# Patient Record
Sex: Female | Born: 1965 | Race: White | Hispanic: No | Marital: Married | Smoking: Never smoker
Health system: Southern US, Community
[De-identification: ages and names within clinical notes are randomized; demographics above are authoritative.]

## PROBLEM LIST (undated history)

## (undated) DIAGNOSIS — Z8619 Personal history of other infectious and parasitic diseases: Secondary | ICD-10-CM

## (undated) DIAGNOSIS — G43909 Migraine, unspecified, not intractable, without status migrainosus: Secondary | ICD-10-CM

## (undated) DIAGNOSIS — E063 Autoimmune thyroiditis: Secondary | ICD-10-CM

## (undated) HISTORY — DX: Migraine, unspecified, not intractable, without status migrainosus: G43.909

## (undated) HISTORY — DX: Autoimmune thyroiditis: E06.3

## (undated) HISTORY — DX: Personal history of other infectious and parasitic diseases: Z86.19

---

## 2011-03-09 LAB — HM MAMMOGRAPHY

## 2011-04-08 LAB — HM MAMMOGRAPHY

## 2012-07-14 ENCOUNTER — Ambulatory Visit: Payer: Self-pay | Admitting: Internal Medicine

## 2012-07-27 ENCOUNTER — Ambulatory Visit (INDEPENDENT_AMBULATORY_CARE_PROVIDER_SITE_OTHER): Payer: BC Managed Care – PPO | Admitting: Internal Medicine

## 2012-07-27 ENCOUNTER — Other Ambulatory Visit (HOSPITAL_COMMUNITY)
Admission: RE | Admit: 2012-07-27 | Discharge: 2012-07-27 | Disposition: A | Discharge status: Home / Self Care | Payer: BC Managed Care – PPO | Source: Ambulatory Visit | Attending: Internal Medicine | Admitting: Internal Medicine

## 2012-07-27 ENCOUNTER — Encounter: Payer: Self-pay | Admitting: Internal Medicine

## 2012-07-27 VITALS — BP 102/62 | HR 67 | Temp 98.0°F | Ht 65.0 in | Wt 131.0 lb

## 2012-07-27 DIAGNOSIS — Z130 Encounter for screening for diseases of the blood and blood-forming organs and certain disorders involving the immune mechanism: Secondary | ICD-10-CM

## 2012-07-27 DIAGNOSIS — E063 Autoimmune thyroiditis: Secondary | ICD-10-CM | POA: Insufficient documentation

## 2012-07-27 DIAGNOSIS — Z13220 Encounter for screening for lipoid disorders: Secondary | ICD-10-CM

## 2012-07-27 DIAGNOSIS — Z01419 Encounter for gynecological examination (general) (routine) without abnormal findings: Secondary | ICD-10-CM | POA: Insufficient documentation

## 2012-07-27 DIAGNOSIS — Z131 Encounter for screening for diabetes mellitus: Secondary | ICD-10-CM

## 2012-07-27 DIAGNOSIS — Z1329 Encounter for screening for other suspected endocrine disorder: Secondary | ICD-10-CM

## 2012-07-27 DIAGNOSIS — Z1151 Encounter for screening for human papillomavirus (HPV): Secondary | ICD-10-CM | POA: Insufficient documentation

## 2012-07-27 DIAGNOSIS — Z124 Encounter for screening for malignant neoplasm of cervix: Secondary | ICD-10-CM

## 2012-07-27 DIAGNOSIS — Z0000 Encounter for general adult medical examination without abnormal findings: Secondary | ICD-10-CM

## 2012-07-27 NOTE — Progress Notes (Signed)
HPI  Pt presents to the clinic today to establish care. She moved from [place] 7 months ago and has not established a PCP in the area. She has no concerns today:  Flu: 2013 Tetanus: less than 10 years ago Eye doctor: yearly Dentist: yearly Pap smear: 2012 Mammogram: 03/2011  Past Medical History  Diagnosis Date  . History of chickenpox   . Hashimoto's thyroiditis   . Migraine     Current Outpatient Prescriptions  Medication Sig Dispense Refill  . SYNTHROID 50 MCG tablet Take 50 mcg by mouth daily before breakfast.        No current facility-administered medications for this visit.    Allergies  Allergen Reactions  . Codeine Nausea Only    Family History  Problem Relation Age of Onset  . Thyroid cancer Other   . Heart disease Other     Parent    History   Social History  . Marital Status: Married    Spouse Name: N/A    Number of Children: N/A  . Years of Education: 16   Occupational History  . Business Owner    Social History Main Topics  . Smoking status: Never Smoker   . Smokeless tobacco: Never Used  . Alcohol Use: Yes  . Drug Use: No  . Sexually Active: Not on file   Other Topics Concern  . Not on file   Social History Narrative   Regular exercise-yes   Caffeine Use-yes    ROS:  Constitutional: Denies fever, malaise, fatigue, headache or abrupt weight changes.  HEENT: Denies eye pain, eye redness, ear pain, ringing in the ears, wax buildup, runny nose, nasal congestion, bloody nose, or sore throat. Respiratory: Denies difficulty breathing, shortness of breath, cough or sputum production.   Cardiovascular: Denies chest pain, chest tightness, palpitations or swelling in the hands or feet.  Gastrointestinal: Denies abdominal pain, bloating, constipation, diarrhea or blood in the stool.  GU: Denies frequency, urgency, pain with urination, blood in urine, odor or discharge. Musculoskeletal: Denies decrease in range of motion, difficulty with gait,  muscle pain or joint pain and swelling.  Skin: Denies redness, rashes, lesions or ulcercations.  Neurological: Denies dizziness, difficulty with memory, difficulty with speech or problems with balance and coordination.   No other specific complaints in a complete review of systems (except as listed in HPI above).  PE:  BP 102/62  Pulse 67  Temp(Src) 98 F (36.7 C) (Oral)  Ht 5\' 5"  (1.651 m)  Wt 131 lb (59.421 kg)  BMI 21.8 kg/m2  SpO2 96% Wt Readings from Last 3 Encounters:  07/27/12 131 lb (59.421 kg)    General: Appears her stated age, well developed, well nourished in NAD. HEENT: Head: normal shape and size; Eyes: sclera white, no icterus, conjunctiva pink, PERRLA and EOMs intact; Ears: Tm's gray and intact, normal light reflex; Nose: mucosa pink and moist, septum midline; Throat/Mouth: Teeth present, mucosa pink and moist, no lesions or ulcerations noted.  Neck: Normal range of motion. Neck supple, trachea midline. No massses, lumps or thyromegaly present.  Cardiovascular: Normal rate and rhythm. S1,S2 noted.  No murmur, rubs or gallops noted. No JVD or BLE edema. No carotid bruits noted. Pulmonary/Chest: Normal effort and positive vesicular breath sounds. No respiratory distress. No wheezes, rales or ronchi noted.  Abdomen: Soft and nontender. Normal bowel sounds, no bruits noted. No distention or masses noted. Liver, spleen and kidneys non palpable. Musculoskeletal: Normal range of motion. No signs of joint swelling. No difficulty with  gait.  Neurological: Alert and oriented. Cranial nerves II-XII intact. Coordination normal. +DTRs bilaterally. Psychiatric: Mood and affect normal. Behavior is normal. Judgment and thought content normal.     Assessment and Plan:  Preventative Health Maintenance:  Will set up mammogram Pap smear obtained today Continue your diet and exercise routine

## 2012-07-27 NOTE — Assessment & Plan Note (Signed)
Will order thyroid ultrasound Referral placed to Dr. Lafe Garin per pt request

## 2012-07-27 NOTE — Patient Instructions (Signed)

## 2012-08-03 ENCOUNTER — Encounter: Payer: Self-pay | Admitting: Internal Medicine

## 2012-08-03 ENCOUNTER — Ambulatory Visit: Payer: BC Managed Care – PPO | Admitting: Endocrinology

## 2012-08-08 ENCOUNTER — Other Ambulatory Visit: Payer: BC Managed Care – PPO

## 2012-08-10 ENCOUNTER — Ambulatory Visit (INDEPENDENT_AMBULATORY_CARE_PROVIDER_SITE_OTHER): Payer: BC Managed Care – PPO | Admitting: Internal Medicine

## 2012-08-10 ENCOUNTER — Encounter: Payer: Self-pay | Admitting: Internal Medicine

## 2012-08-10 VITALS — BP 110/68 | HR 63 | Temp 97.5°F | Ht 65.0 in | Wt 131.0 lb

## 2012-08-10 DIAGNOSIS — J069 Acute upper respiratory infection, unspecified: Secondary | ICD-10-CM

## 2012-08-10 NOTE — Patient Instructions (Signed)

## 2012-08-10 NOTE — Progress Notes (Signed)
Subjective:    Patient ID: Shelby Harvey, female    DOB: 1965/10/19, 47 y.o.   MRN: [id number]  HPI  Pt presents to the clinic today for hospital f/u. She was seen at [facility] ER on 08/04/2012 for abdominal pain. Xray showed mild constipation but no acute findings. She ended up being diagnosed with mountain sickness. Her abdominal pain has resolved. She does have a mild cough and does at time feel shortness of breath. She is not sure if she picked up something on her way back.. She denies fever, chills or body aches. She has not had sick contacts that she is aware of.   Review of Systems      Past Medical History  Diagnosis Date  . History of chickenpox   . Hashimoto's thyroiditis   . Migraine     Current Outpatient Prescriptions  Medication Sig Dispense Refill  . SYNTHROID 50 MCG tablet Take 50 mcg by mouth daily before breakfast.        No current facility-administered medications for this visit.    Allergies  Allergen Reactions  . Codeine Nausea Only    Family History  Problem Relation Age of Onset  . Thyroid cancer Other   . Heart disease Other     Parent    History   Social History  . Marital Status: Married    Spouse Name: N/A    Number of Children: N/A  . Years of Education: 16   Occupational History  . Business Owner    Social History Main Topics  . Smoking status: Never Smoker   . Smokeless tobacco: Never Used  . Alcohol Use: Yes  . Drug Use: No  . Sexually Active: Not on file   Other Topics Concern  . Not on file   Social History Narrative   Regular exercise-yes   Caffeine Use-yes     Constitutional: Denies fever, malaise, fatigue, headache or abrupt weight changes.  HEENT: Denies eye pain, eye redness, ear pain, ringing in the ears, wax buildup, runny nose, nasal congestion, bloody nose, or sore throat. Respiratory: Pt reports intermittent shortness of breath and cough. Denies difficulty breathing, or sputum production.   Cardiovascular:  Denies chest pain, chest tightness, palpitations or swelling in the hands or feet.  Gastrointestinal: Denies abdominal pain, bloating, constipation, diarrhea or blood in the stool.    No other specific complaints in a complete review of systems (except as listed in HPI above).  Objective:   Physical Exam   BP 110/68  Pulse 63  Temp(Src) 97.5 F (36.4 C) (Oral)  Ht 5\' 5"  (1.651 m)  Wt 131 lb (59.421 kg)  BMI 21.8 kg/m2  SpO2 98% Wt Readings from Last 3 Encounters:  08/10/12 131 lb (59.421 kg)  07/27/12 131 lb (59.421 kg)    General: Appears her stated age, well developed, well nourished in NAD. HEENT: Head: normal shape and size; Eyes: sclera white, no icterus, conjunctiva pink, PERRLA and EOMs intact; Ears: Tm's gray and intact, normal light reflex; Nose: mucosa pink and moist, septum midline; Throat/Mouth: Teeth present, mucosa pink and moist, no exudate, lesions or ulcerations noted.  Neck: Normal range of motion. Neck supple, trachea midline. No massses, lumps or thyromegaly present.  Cardiovascular: Normal rate and rhythm. S1,S2 noted.  No murmur, rubs or gallops noted. No JVD or BLE edema. No carotid bruits noted. Pulmonary/Chest: Normal effort and positive vesicular breath sounds. No respiratory distress. No wheezes, rales or ronchi noted.  Abdomen: Soft and nontender. Normal bowel  sounds, no bruits noted. No distention or masses noted. Liver, spleen and kidneys non palpable.         Assessment & Plan:   Abdominal pain associated with mountain sickness, resolved:  Continue to monitor symptoms Call back to RTC if they return  Cough:  Likely viral, new onset:  Get some rest and stay well hydrated Treat symptomatically with OTC right now If persist or worsens for more than 1 week, RTC

## 2012-08-11 ENCOUNTER — Encounter: Payer: Self-pay | Admitting: Internal Medicine

## 2012-08-14 ENCOUNTER — Other Ambulatory Visit: Payer: Self-pay

## 2012-08-14 DIAGNOSIS — Z1231 Encounter for screening mammogram for malignant neoplasm of breast: Secondary | ICD-10-CM

## 2012-08-15 ENCOUNTER — Encounter: Payer: Self-pay | Admitting: Internal Medicine

## 2012-08-15 MED ORDER — AZITHROMYCIN 250 MG PO TABS: ORAL_TABLET | ORAL | Status: DC

## 2012-08-16 ENCOUNTER — Encounter: Payer: Self-pay | Admitting: Internal Medicine

## 2012-08-30 ENCOUNTER — Ambulatory Visit
Admission: RE | Admit: 2012-08-30 | Discharge: 2012-08-30 | Disposition: A | Discharge status: Home / Self Care | Payer: BC Managed Care – PPO | Source: Ambulatory Visit | Attending: Internal Medicine | Admitting: Internal Medicine

## 2012-08-30 DIAGNOSIS — E063 Autoimmune thyroiditis: Secondary | ICD-10-CM

## 2012-09-21 ENCOUNTER — Ambulatory Visit
Admission: RE | Admit: 2012-09-21 | Discharge: 2012-09-21 | Disposition: A | Discharge status: Home / Self Care | Payer: BC Managed Care – PPO | Source: Ambulatory Visit

## 2012-09-21 DIAGNOSIS — Z1231 Encounter for screening mammogram for malignant neoplasm of breast: Secondary | ICD-10-CM

## 2012-09-25 ENCOUNTER — Other Ambulatory Visit: Payer: Self-pay | Admitting: Internal Medicine

## 2012-09-25 DIAGNOSIS — R928 Other abnormal and inconclusive findings on diagnostic imaging of breast: Secondary | ICD-10-CM

## 2012-10-05 ENCOUNTER — Encounter: Payer: Self-pay | Admitting: Internal Medicine

## 2012-10-18 ENCOUNTER — Ambulatory Visit
Admission: RE | Admit: 2012-10-18 | Discharge: 2012-10-18 | Disposition: A | Discharge status: Home / Self Care | Payer: BC Managed Care – PPO | Source: Ambulatory Visit | Attending: Internal Medicine | Admitting: Internal Medicine

## 2012-10-18 DIAGNOSIS — R928 Other abnormal and inconclusive findings on diagnostic imaging of breast: Secondary | ICD-10-CM

## 2012-10-26 ENCOUNTER — Other Ambulatory Visit: Payer: Self-pay | Admitting: Internal Medicine

## 2012-10-26 DIAGNOSIS — R921 Mammographic calcification found on diagnostic imaging of breast: Secondary | ICD-10-CM

## 2013-01-09 ENCOUNTER — Encounter: Payer: Self-pay | Admitting: Internal Medicine

## 2013-01-09 ENCOUNTER — Ambulatory Visit (INDEPENDENT_AMBULATORY_CARE_PROVIDER_SITE_OTHER): Payer: BC Managed Care – PPO | Admitting: Internal Medicine

## 2013-01-09 ENCOUNTER — Other Ambulatory Visit (INDEPENDENT_AMBULATORY_CARE_PROVIDER_SITE_OTHER): Payer: BC Managed Care – PPO

## 2013-01-09 VITALS — BP 102/70 | HR 75 | Temp 97.6°F | Ht 65.0 in | Wt 134.4 lb

## 2013-01-09 DIAGNOSIS — E063 Autoimmune thyroiditis: Secondary | ICD-10-CM

## 2013-01-09 DIAGNOSIS — M79609 Pain in unspecified limb: Secondary | ICD-10-CM

## 2013-01-09 DIAGNOSIS — M79674 Pain in right toe(s): Secondary | ICD-10-CM | POA: Insufficient documentation

## 2013-01-09 LAB — LIPID PANEL
HDL: 74.4 mg/dL (ref >39.00)
Triglycerides: 67 mg/dL (ref 0.0–149.0)

## 2013-01-09 NOTE — Patient Instructions (Signed)
Please continue all other medications as before, and refills have been done if requested. Please have the pharmacy call with any other refills you may need. You will be contacted regarding the referral for: podiatry Please go to the LAB in the Basement (turn left off the elevator) for the tests to be done today You will be contacted by phone if any changes need to be made immediately.  Otherwise, you will receive a letter about your results with an explanation, but please check with MyChart first.  Please remember to sign up for My Chart if you have not done so, as this will be important to you in the future with finding out test results, communicating by private email, and scheduling acute appointments online when needed.

## 2013-01-09 NOTE — Progress Notes (Signed)
  Subjective:    Patient ID: Shelby Harvey, female    DOB: 1966/02/12, 47 y.o.   MRN: [id number]  HPI  Here with onset right first MTP pain x 1 mo, seemed to start after an evening in high heels, just not improved pain although the red/swelling has largely resolved, hard to wear usual shoes, has upcoming work function that will require more standing Past Medical History  Diagnosis Date  . History of chickenpox   . Hashimoto's thyroiditis   . Migraine    No past surgical history on file.  reports that she has never smoked. She has never used smokeless tobacco. She reports that she drinks alcohol. She reports that she does not use illicit drugs. family history includes Heart disease in her other; Thyroid cancer in her other. Allergies  Allergen Reactions  . Codeine Nausea Only   Current Outpatient Prescriptions on File Prior to Visit  Medication Sig Dispense Refill  . SYNTHROID 50 MCG tablet Take 50 mcg by mouth daily before breakfast.        No current facility-administered medications on file prior to visit.   Review of Systems All otherwise neg per pt     Objective:   Physical Exam BP 102/70  Pulse 75  Temp(Src) 97.6 F (36.4 C) (Oral)  Ht 5\' 5"  (1.651 m)  Wt 134 lb 6 oz (60.952 kg)  BMI 22.36 kg/m2  SpO2 98% VS noted,  Constitutional: Pt appears well-developed and well-nourished.  HENT: Head: NCAT.  Right Ear: External ear normal.  Left Ear: External ear normal.  Eyes: Conjunctivae and EOM are normal. Pupils are equal, round, and reactive to light.  Neck: Normal range of motion. Neck supple.  Cardiovascular: Normal rate and regular rhythm.   Pulmonary/Chest: Effort normal and breath sounds normal.  Abd:  Soft, NT, non-distended, + BS Neurological: Pt is alert. Not confused  Skin: Skin is warm. No erythema. but right first MTP with evidence mild to mod bunion, with trace lateral deviation right great first toe Psychiatric: Pt behavior is normal. Thought content  normal.      Assessment & Plan:

## 2013-01-09 NOTE — Assessment & Plan Note (Signed)
C/w painful bunion with flare x 1 mo/DJD, for podiatry referral

## 2013-01-09 NOTE — Assessment & Plan Note (Signed)
Pt also asks for lipid profile so can present data at work to insur previum reduction

## 2013-01-11 ENCOUNTER — Encounter: Payer: Self-pay | Admitting: Internal Medicine

## 2013-01-11 ENCOUNTER — Other Ambulatory Visit: Payer: Self-pay

## 2013-01-16 NOTE — Telephone Encounter (Signed)
Robin to see note per pt

## 2013-01-17 ENCOUNTER — Telehealth: Payer: Self-pay | Admitting: *Deleted

## 2013-01-17 ENCOUNTER — Ambulatory Visit (INDEPENDENT_AMBULATORY_CARE_PROVIDER_SITE_OTHER): Payer: BC Managed Care – HMO | Admitting: Podiatry

## 2013-01-17 ENCOUNTER — Ambulatory Visit (INDEPENDENT_AMBULATORY_CARE_PROVIDER_SITE_OTHER): Payer: BC Managed Care – HMO

## 2013-01-17 ENCOUNTER — Encounter: Payer: Self-pay | Admitting: Podiatry

## 2013-01-17 VITALS — BP 90/54 | HR 67 | Resp 16 | Ht 65.0 in | Wt 130.0 lb

## 2013-01-17 DIAGNOSIS — M2020 Hallux rigidus, unspecified foot: Secondary | ICD-10-CM

## 2013-01-17 DIAGNOSIS — M21611 Bunion of right foot: Secondary | ICD-10-CM

## 2013-01-17 DIAGNOSIS — M779 Enthesopathy, unspecified: Secondary | ICD-10-CM

## 2013-01-17 DIAGNOSIS — M21619 Bunion of unspecified foot: Secondary | ICD-10-CM

## 2013-01-17 DIAGNOSIS — M2021 Hallux rigidus, right foot: Secondary | ICD-10-CM

## 2013-01-17 MED ORDER — TRIAMCINOLONE ACETONIDE 10 MG/ML IJ SUSP
5.0000 mg | Freq: Once | INTRAMUSCULAR | Status: AC
  Administered 2013-01-17: 5 mg via INTRA_ARTICULAR

## 2013-01-17 NOTE — Progress Notes (Signed)
Subjective:     Patient ID: Shelby Harvey, female   DOB: [birth year], 47 y.o.   MRN: [id number]  Foot Pain   patient complains of pain in the right big toe joint stating it's been hurting her for about 6 weeks after excessive activity and that she has a significant family history of people having had bunion and surgery   Review of Systems  All other systems reviewed and are negative.       Objective:   Physical Exam  Constitutional: She is oriented to person, place, and time.  Cardiovascular: Intact distal pulses.   Musculoskeletal: Normal range of motion.  Neurological: She is oriented to person, place, and time.  Skin: Skin is warm.   neurovascular status intact with normal muscle strength and no equinus condition noted. Pain in the right first MPJ lateral side and across the dorsal surface with inflammation and indications of functional hallux limitus condition     Assessment:     Capsulitis of the right first MPJ with functional hallux limitus as part of the problem    Plan:     H&P performed in condition explained the patient today careful injection 3 mg Kenalog 5 mg Xylocaine Marcaine mixture around the joint surface and discussed orthotic therapy for long-term. Reappoint 4 weeks for discussion

## 2013-01-17 NOTE — Telephone Encounter (Addendum)
Pt states that Dr Charlsie Merles was to order an antiinflammatory medication for her today, but it was not at the [facility] on [address].  I checked today's notes and there was no mention of a medication.  I told pt I would contact her with the prescription tomorrow.  Pt agreed.  Pt called again to state the antiinflamatory was not called in yet.  I apologied and DR Charlsie Merles ordered Mobic 15 mg #30 1 tablet po qd.

## 2013-01-17 NOTE — Progress Notes (Signed)
  Subjective:    Patient ID: Shelby Harvey, female    DOB: 03/07/1966, 47 y.o.   MRN: [id number]  HPI Comments: N - aches L - 1st MPJ right D - few mos O - sudden C - was wearing heels all day shopping and noticed immediate pain afterwards, gets red and swollen  A - shoes, walking T - tried wider, more cushioned shoes      Review of Systems  All other systems reviewed and are negative.       Objective:   Physical Exam        Assessment & Plan:

## 2013-01-18 ENCOUNTER — Ambulatory Visit: Payer: BC Managed Care – PPO | Admitting: Internal Medicine

## 2013-01-18 MED ORDER — MELOXICAM 15 MG PO TABS: 15.0000 mg | ORAL_TABLET | Freq: Every day | ORAL | Status: AC

## 2013-02-14 ENCOUNTER — Ambulatory Visit (INDEPENDENT_AMBULATORY_CARE_PROVIDER_SITE_OTHER): Payer: BC Managed Care – HMO | Admitting: Podiatry

## 2013-02-14 ENCOUNTER — Encounter: Payer: Self-pay | Admitting: Podiatry

## 2013-02-14 VITALS — BP 88/60 | HR 69 | Resp 16

## 2013-02-14 DIAGNOSIS — M2021 Hallux rigidus, right foot: Secondary | ICD-10-CM

## 2013-02-14 DIAGNOSIS — M2020 Hallux rigidus, unspecified foot: Secondary | ICD-10-CM

## 2013-02-14 DIAGNOSIS — M779 Enthesopathy, unspecified: Secondary | ICD-10-CM

## 2013-02-14 NOTE — Progress Notes (Signed)
Subjective:     Patient ID: Shelby Harvey, female   DOB: 15-May-1965, 47 y.o.   MRN: [id number]  HPI patient states my foot is some better but certain shoes are still really bothering the big toe joint   Review of Systems     Objective:   Physical Exam Neurovascular intact with a prominent first metatarsal especially when weightbearing is initiated with functional limitation in joint motion but no pain when I pressed into the joint lateral side    Assessment:     Improved with the injection but chronic functional hallux limitus condition noted with capsulitis    Plan:     Reviewed and educated patient discussing surgery and conservative care. We've opted for a continued conservative approach and shoe gear modification is recommended and today patient is scanned if orthotics to reduce stress against the joint surface reappoint for [place] to recheck again when orthotics returned

## 2013-02-23 ENCOUNTER — Encounter: Payer: Self-pay | Admitting: *Deleted

## 2013-03-22 ENCOUNTER — Ambulatory Visit: Payer: 59 | Admitting: Podiatry

## 2013-03-23 ENCOUNTER — Other Ambulatory Visit: Payer: Self-pay

## 2013-03-23 ENCOUNTER — Ambulatory Visit: Payer: 59 | Admitting: *Deleted

## 2013-03-23 DIAGNOSIS — M21611 Bunion of right foot: Secondary | ICD-10-CM

## 2013-03-23 NOTE — Patient Instructions (Signed)

## 2013-04-27 ENCOUNTER — Ambulatory Visit
Admission: RE | Admit: 2013-04-27 | Discharge: 2013-04-27 | Disposition: A | Discharge status: Home / Self Care | Payer: 59 | Source: Ambulatory Visit | Attending: Internal Medicine | Admitting: Internal Medicine

## 2013-04-27 ENCOUNTER — Other Ambulatory Visit: Payer: Self-pay | Admitting: Internal Medicine

## 2013-04-27 DIAGNOSIS — R921 Mammographic calcification found on diagnostic imaging of breast: Secondary | ICD-10-CM

## 2013-05-02 ENCOUNTER — Ambulatory Visit: Payer: 59 | Admitting: Podiatry

## 2013-05-08 ENCOUNTER — Other Ambulatory Visit: Payer: Self-pay | Admitting: Internal Medicine

## 2013-05-08 DIAGNOSIS — R921 Mammographic calcification found on diagnostic imaging of breast: Secondary | ICD-10-CM

## 2013-05-31 ENCOUNTER — Ambulatory Visit: Payer: 59 | Admitting: Podiatry

## 2013-05-31 ENCOUNTER — Ambulatory Visit (INDEPENDENT_AMBULATORY_CARE_PROVIDER_SITE_OTHER): Payer: 59 | Admitting: Podiatry

## 2013-05-31 ENCOUNTER — Encounter: Payer: Self-pay | Admitting: Podiatry

## 2013-05-31 VITALS — BP 118/64 | HR 64 | Resp 12

## 2013-05-31 DIAGNOSIS — M2020 Hallux rigidus, unspecified foot: Secondary | ICD-10-CM

## 2013-05-31 NOTE — Patient Instructions (Signed)
Pre-Operative Instructions  Congratulations, you have decided to take an important step to improving your quality of life.  You can be assured that the doctors of [facility] will be with you every step of the way.  1. Plan to be at the surgery center/hospital at least 1 (one) hour prior to your scheduled time unless otherwise directed by the surgical center/hospital staff.  You must have a responsible adult accompany you, remain during the surgery and drive you home.  Make sure you have directions to the surgical center/hospital and know how to get there on time. 2. For hospital based surgery you will need to obtain a history and physical form from your family physician within 1 month prior to the date of surgery- we will give you a form for you primary physician.  3. We make every effort to accommodate the date you request for surgery.  There are however, times where surgery dates or times have to be moved.  We will contact you as soon as possible if a change in schedule is required.   4. No Aspirin/Ibuprofen for one week before surgery.  If you are on aspirin, any non-steroidal anti-inflammatory medications (Mobic, Aleve, Ibuprofen) you should stop taking it 7 days prior to your surgery.  You make take Tylenol  For pain prior to surgery.  5. Medications- If you are taking daily heart and blood pressure medications, seizure, reflux, allergy, asthma, anxiety, pain or diabetes medications, make sure the surgery center/hospital is aware before the day of surgery so they may notify you which medications to take or avoid the day of surgery. 6. No food or drink after midnight the night before surgery unless directed otherwise by surgical center/hospital staff. 7. No alcoholic beverages 24 hours prior to surgery.  No smoking 24 hours prior to or 24 hours after surgery. 8. Wear loose pants or shorts- loose enough to fit over bandages, boots, and casts. 9. No slip on shoes, sneakers are best. 10. Bring  your boot with you to the surgery center/hospital.  Also bring crutches or a walker if your physician has prescribed it for you.  If you do not have this equipment, it will be provided for you after surgery. 11. If you have not been contracted by the surgery center/hospital by the day before your surgery, call to confirm the date and time of your surgery. 12. Leave-time from work may vary depending on the type of surgery you have.  Appropriate arrangements should be made prior to surgery with your employer. 13. Prescriptions will be provided immediately following surgery by your doctor.  Have these filled as soon as possible after surgery and take the medication as directed. 14. Remove nail polish on the operative foot. 15. Wash the night before surgery.  The night before surgery wash the foot and leg well with the antibacterial soap provided and water paying special attention to beneath the toenails and in between the toes.  Rinse thoroughly with water and dry well with a towel.  Perform this wash unless told not to do so by your physician.  Enclosed: 1 Ice pack (please put in freezer the night before surgery)   1 Hibiclens skin cleaner   Pre-op Instructions  If you have any questions regarding the instructions, do not hesitate to call our office.  [place]: [address] [place], [place] [address] [phone]  [place]: [address] [place], [place] [address] [phone]  [place]: [address]  [place], [place] [address] [phone]  Dr. Richard   Tuchman DPM, Dr. Norman Regal DPM Dr. Richard Sikora DPM, Dr. M. Todd Hyatt DPM, Dr. Kathryn Egerton DPM 

## 2013-06-01 NOTE — Progress Notes (Signed)
Subjective:     Patient ID: Shelby Harvey, female   DOB: 07/04/1965, 48 y.o.   MRN: [id number]  HPI patient presents with painful first metatarsophalangeal joint right that has not responded well to orthotics and is becoming worse when she tries to ambulate. Has a significant family history of this problem that has required surgery for many family members   Review of Systems     Objective:   Physical Exam Neurovascular status intact with no change in health history and patient well oriented x3. His comfort first metatarsal-phalangeal joint right with restricted motion and no crepitus within the joint the quite a bit of pain especially when the lateral side is pressed    Assessment:     Hallux limitus deformity right with inflammation and discomfort of the lateral side    Plan:     H&P reviewed and options discussed with patient. She has requested surgery and I did explain hallux limitus surgery and the fact that maybe joint damage and ultimately this could require a fusion or joint implantation procedure. She understands risk and wants to proceed and I allowed her to read consent form concerning biplanar [place] bunionectomy right with possible subchondral bone drilling. all risks were discussed with the patient and after complete review she signed consent form and she understands total recovery period will be 6 months to one year. Patient was dispensed air fracture walker with all instructions on usage for usage after surgery

## 2013-06-04 ENCOUNTER — Telehealth: Payer: Self-pay | Admitting: *Deleted

## 2013-06-04 NOTE — Telephone Encounter (Signed)
I scheduled surgery for 07/17/13.  I'd like to reschedule for 08/07/13 if possible.  I left her a message that we will reschedule her surgery to 08/07/13.

## 2013-06-12 ENCOUNTER — Telehealth: Payer: Self-pay | Admitting: *Deleted

## 2013-06-12 NOTE — Telephone Encounter (Signed)
I'm tentatively scheduled for surgery for 08/17/13.  I decided against it, no follow up needed.  Please confirm that It's been canceled.  I called and informed her we'll take care of it for her.  Call if she'd like to reschedule.

## 2013-06-18 ENCOUNTER — Encounter: Payer: Self-pay | Admitting: Podiatry

## 2013-08-08 ENCOUNTER — Telehealth: Payer: Self-pay

## 2013-08-08 NOTE — Telephone Encounter (Signed)
Called pt left voice mail regarding post op status

## 2013-08-13 ENCOUNTER — Encounter: Payer: 59 | Admitting: Podiatry

## 2013-10-23 ENCOUNTER — Ambulatory Visit
Admission: RE | Admit: 2013-10-23 | Discharge: 2013-10-23 | Disposition: A | Discharge status: Home / Self Care | Payer: 59 | Source: Ambulatory Visit | Attending: Internal Medicine | Admitting: Internal Medicine

## 2013-10-23 DIAGNOSIS — R921 Mammographic calcification found on diagnostic imaging of breast: Secondary | ICD-10-CM

## 2013-10-23 LAB — HM MAMMOGRAPHY

## 2014-03-18 LAB — HM COLONOSCOPY

## 2014-03-19 ENCOUNTER — Encounter: Payer: Self-pay | Admitting: General Practice

## 2014-04-10 ENCOUNTER — Encounter: Payer: Self-pay | Admitting: Internal Medicine

## 2014-04-15 NOTE — Progress Notes (Signed)
Order(s) created erroneously. Erroneous order ID: [id number]  Order moved by: Ian MalkinEVERHART, Dacoda Finlay F  Order move date/time: 04/15/2014 5:15 PM  Source Patient: [id number]  Source Contact: 04/10/2014  Destination Patient: [id number]  Destination Contact: 05/23/2012

## 2015-02-09 IMAGING — US US SOFT TISSUE HEAD/NECK
1 series · 14 of 25 positions shown · non-contrast
Comparison: None.

CLINICAL DATA: Hashimoto thyroiditis

THYROID ULTRASOUND
TECHNIQUE: Ultrasound examination of the thyroid gland and adjacent
soft tissues was performed.

[Series 1: us soft tissue head/neck · 0.08mm/px · 14 of 55 slices shown]
[im 1/55]
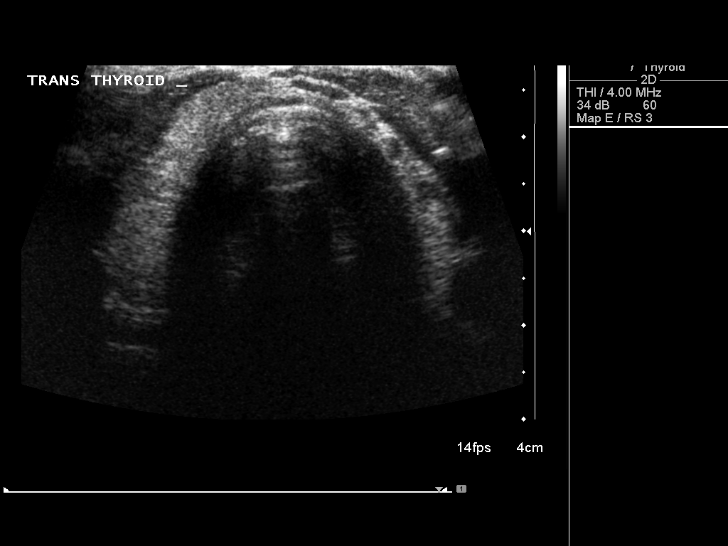
[im 5/55]
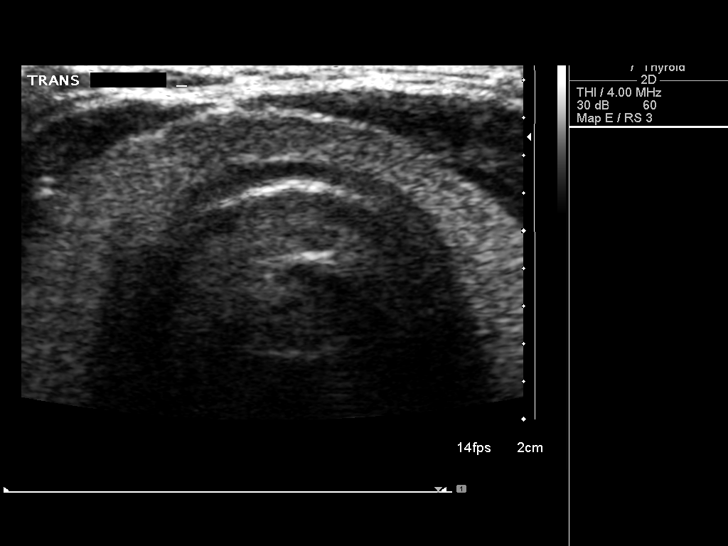
[im 10/55]
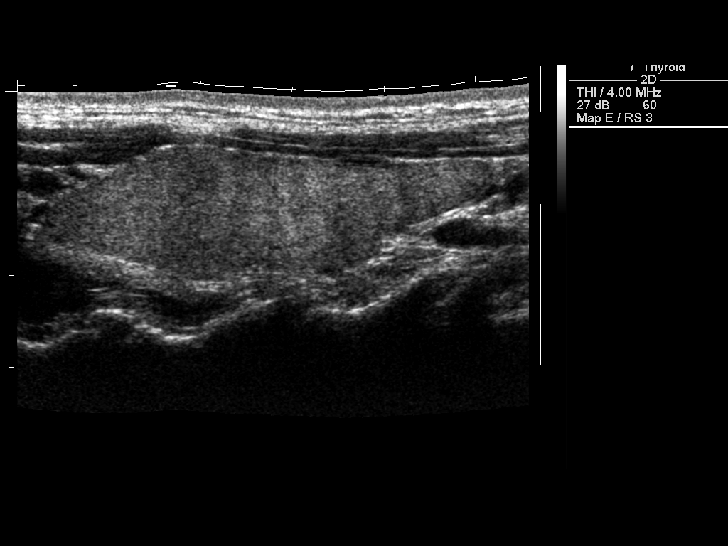
[im 14/55]
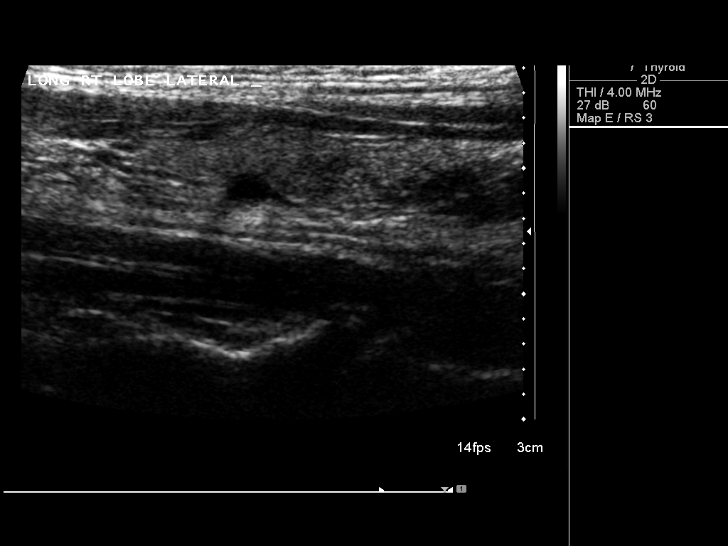
[im 19/55]
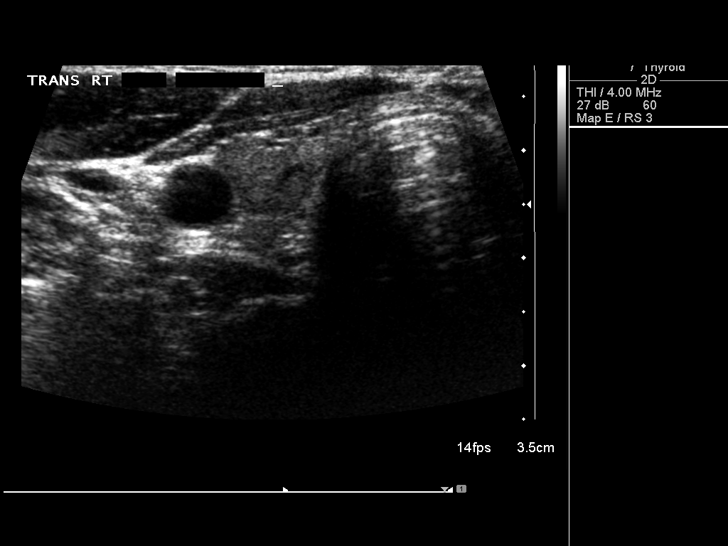
[im 21/55]
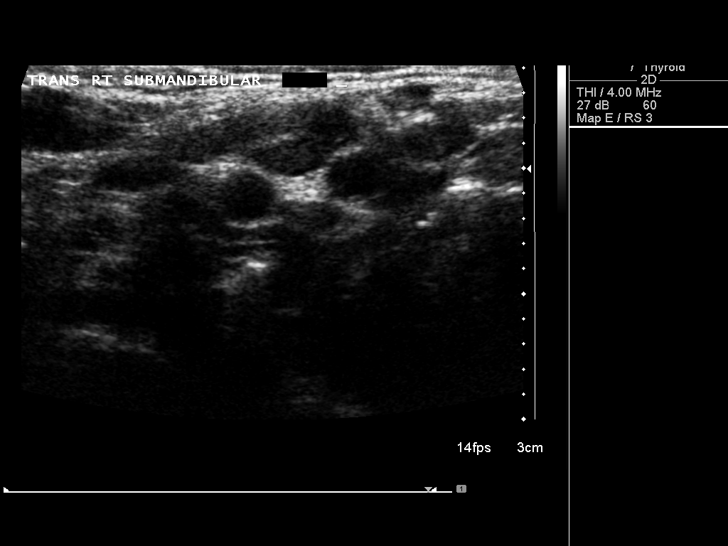
[im 25/55]
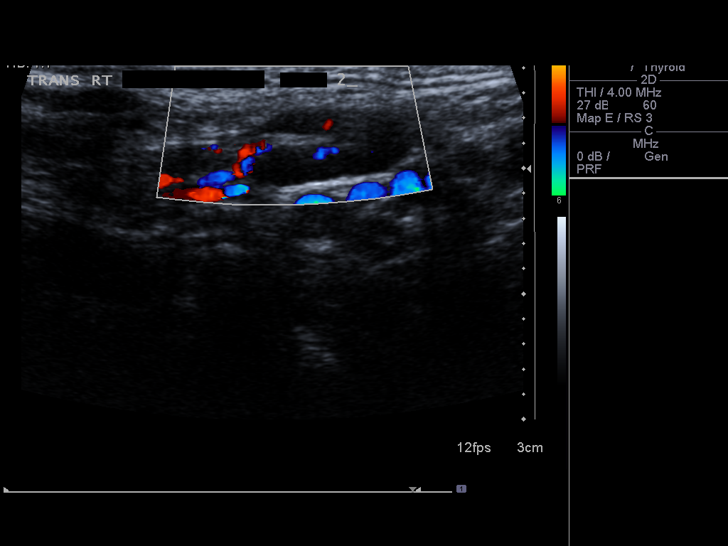
[im 30/55]
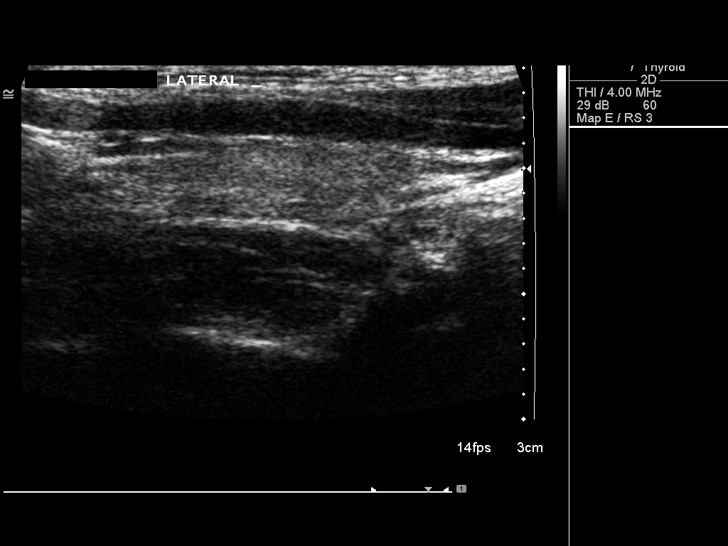
[im 34/55]
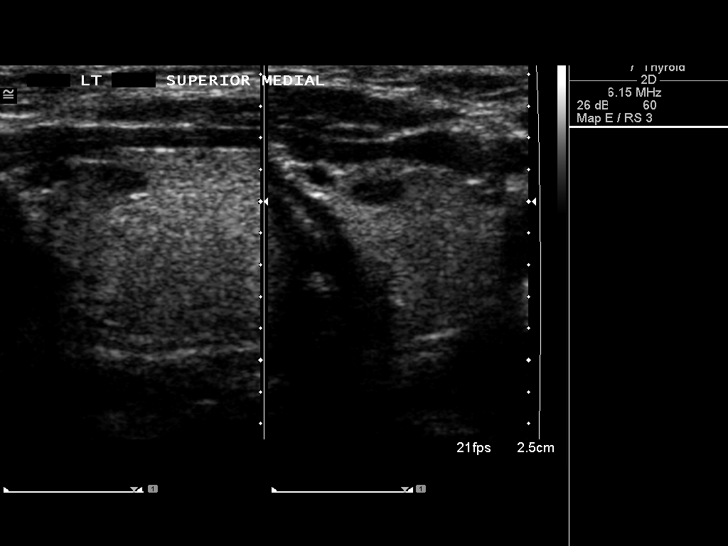
[im 37/55]
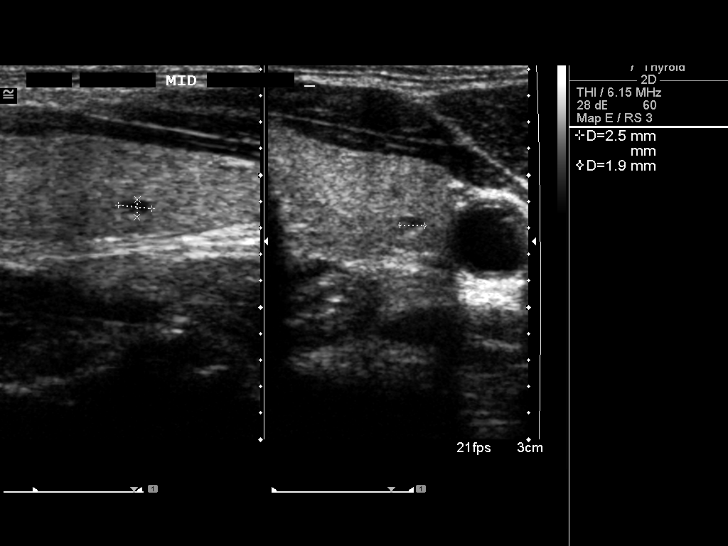
[im 41/55]
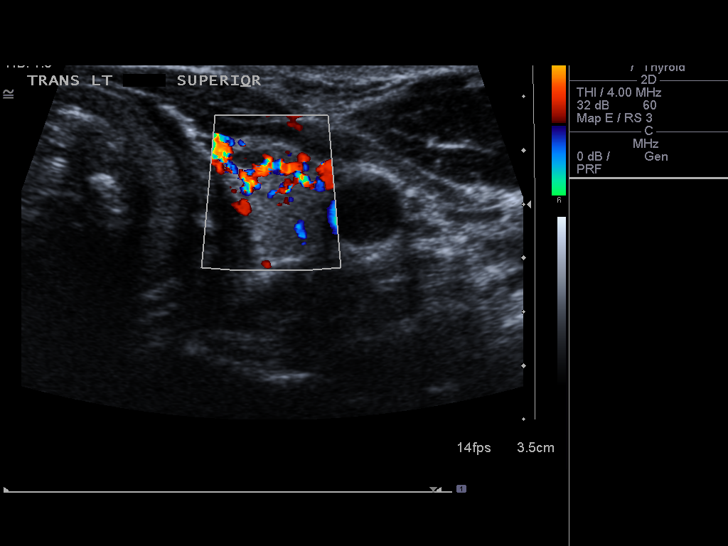
[im 46/55]
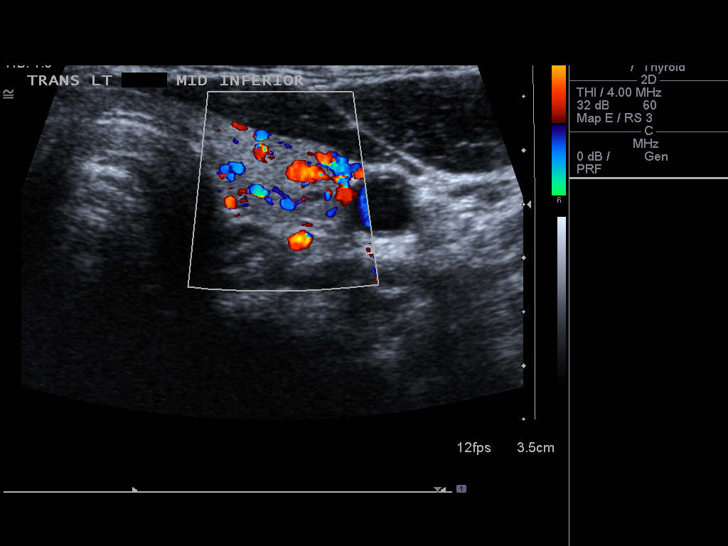
[im 50/55]
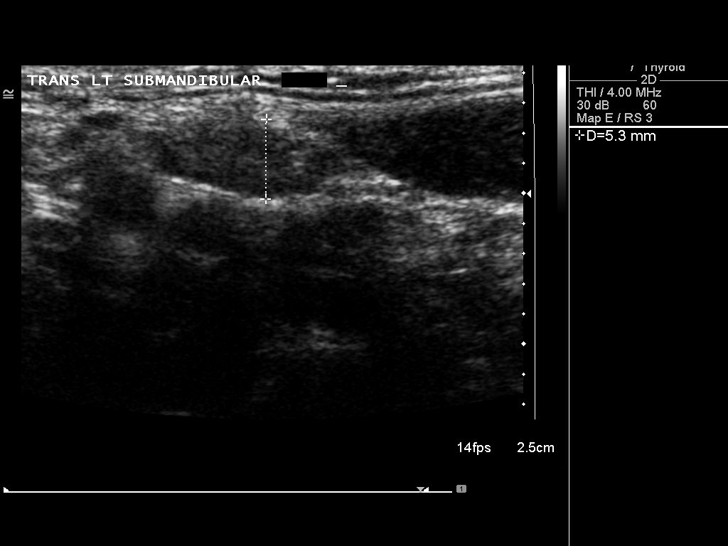
[im 55/55]
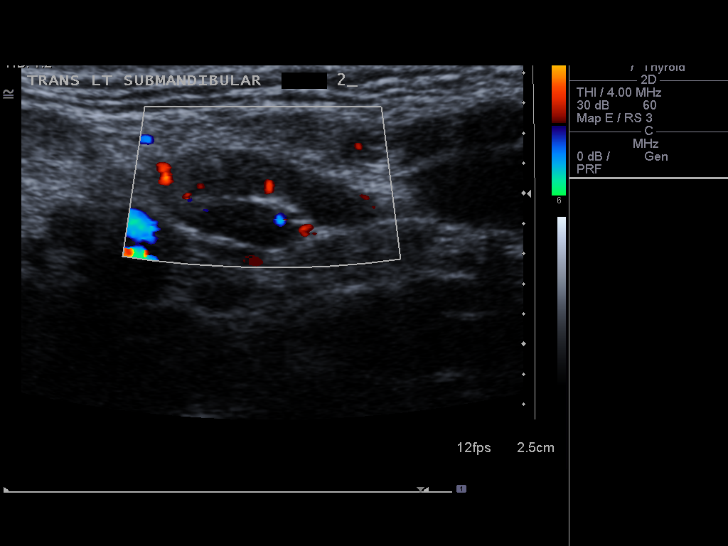

[14 of 25 positions shown; findings below may reference images not displayed]

FINDINGS: Right thyroid lobe:  5.0 x 1.5 x 1.6 cm.
Left thyroid lobe:  4.2 x 1.4 x 1.3 cm.
Isthmus:  2.2 mm in thickness.

Focal nodules:  The echogenicity of the thyroid parenchyma is
minimally inhomogeneous.  Only small nodules are present on the
left of no more than 4 mm in diameter.

Lymphadenopathy:  Slightly prominent submandibular lymph nodes are
present, no larger than 7 mm in short axis diameter.
IMPRESSION: The thyroid gland is normal in size with only tiny nodules on the
left of no more than 4 mm in diameter.
